# Patient Record
Sex: Male | Born: 1995 | Hispanic: Yes | Marital: Single | Smoking: Current some day smoker
Health system: Southern US, Community
[De-identification: ages and names within clinical notes are randomized; demographics above are authoritative.]

---

## 2020-12-14 ENCOUNTER — Emergency Department (HOSPITAL_COMMUNITY)
Admission: EM | Admit: 2020-12-14 | Discharge: 2020-12-14 | Disposition: A | Discharge status: Home / Self Care | Payer: Self-pay | Attending: Emergency Medicine | Admitting: Emergency Medicine

## 2020-12-14 ENCOUNTER — Emergency Department (HOSPITAL_COMMUNITY): Payer: Self-pay

## 2020-12-14 ENCOUNTER — Encounter (HOSPITAL_COMMUNITY): Payer: Self-pay

## 2020-12-14 ENCOUNTER — Other Ambulatory Visit: Payer: Self-pay

## 2020-12-14 DIAGNOSIS — R202 Paresthesia of skin: Secondary | ICD-10-CM | POA: Insufficient documentation

## 2020-12-14 DIAGNOSIS — F17200 Nicotine dependence, unspecified, uncomplicated: Secondary | ICD-10-CM | POA: Insufficient documentation

## 2020-12-14 DIAGNOSIS — R200 Anesthesia of skin: Secondary | ICD-10-CM

## 2020-12-14 DIAGNOSIS — R519 Headache, unspecified: Secondary | ICD-10-CM

## 2020-12-14 DIAGNOSIS — W11XXXA Fall on and from ladder, initial encounter: Secondary | ICD-10-CM | POA: Insufficient documentation

## 2020-12-14 DIAGNOSIS — M542 Cervicalgia: Secondary | ICD-10-CM | POA: Insufficient documentation

## 2020-12-14 DIAGNOSIS — S060X9A Concussion with loss of consciousness of unspecified duration, initial encounter: Secondary | ICD-10-CM | POA: Insufficient documentation

## 2020-12-14 DIAGNOSIS — M25512 Pain in left shoulder: Secondary | ICD-10-CM | POA: Insufficient documentation

## 2020-12-14 MED ORDER — PROCHLORPERAZINE EDISYLATE 10 MG/2ML IJ SOLN
10.0000 mg | Freq: Once | INTRAMUSCULAR | Status: AC
  Administered 2020-12-14: 21:00:00 10 mg via INTRAVENOUS
  Filled 2020-12-14: qty 2

## 2020-12-14 MED ORDER — PROCHLORPERAZINE MALEATE 10 MG PO TABS: 10.0000 mg | ORAL_TABLET | Freq: Three times a day (TID) | ORAL | 0 refills | Status: AC | PRN

## 2020-12-14 MED ORDER — DIPHENHYDRAMINE HCL 50 MG/ML IJ SOLN
25.0000 mg | Freq: Once | INTRAMUSCULAR | Status: AC
  Administered 2020-12-14: 21:00:00 25 mg via INTRAVENOUS
  Filled 2020-12-14: qty 1

## 2020-12-14 MED ORDER — SODIUM CHLORIDE 0.9 % IV BOLUS
1000.0000 mL | Freq: Once | INTRAVENOUS | Status: AC
  Administered 2020-12-14: 21:00:00 1000 mL via INTRAVENOUS

## 2020-12-14 NOTE — Discharge Instructions (Signed)
Your work-up today did not show evidence of acute traumatic injuries, fractures, or bleeding.  The MRI did not show evidence of cord injury.  I suspect you have a concussion causing your symptoms.  Please use the bed sent help with your headache and nausea.  Please maintain hydration.  Please rest.  Please follow-up in the concussion clinic with Dr. Katrinka Blazing.  If any symptoms change or worsen, please return to the nearest emergency department.

## 2020-12-14 NOTE — ED Triage Notes (Signed)
Pt presents after a head injury that occurred on Friday. Pt reports he fell approx 9 feet off of the roof, hitting his head. Pt reports that since then he has been vomiting.

## 2020-12-14 NOTE — ED Provider Notes (Signed)
[facility]-EMERGENCY DEPT Provider Note   CSN: [id number] Arrival date & time: 12/14/20  1753     History Chief Complaint  Patient presents with  . Emesis  . Head Injury    Allen Holland is a 24 y.o. male.  The history is provided by the patient and medical records. No language interpreter was used.  Fall This is a new problem. The current episode started more than 2 days ago. The problem occurs constantly. The problem has not changed since onset.Associated symptoms include headaches. Pertinent negatives include no chest pain, no abdominal pain and no shortness of breath. Nothing aggravates the symptoms. Nothing relieves the symptoms. He has tried nothing for the symptoms. The treatment provided no relief.       History reviewed. No pertinent past medical history.  There are no problems to display for this patient.   History reviewed. No pertinent surgical history.     History reviewed. No pertinent family history.  Social History   Tobacco Use  . Smoking status: Current Some Day Smoker  . Smokeless tobacco: Never Used  Substance Use Topics  . Alcohol use: Never  . Drug use: Never    Home Medications Prior to Admission medications   Not on File    Allergies    Patient has no known allergies.  Review of Systems   Review of Systems  Constitutional: Negative for chills, diaphoresis, fatigue and fever.  HENT: Negative for congestion.   Eyes: Positive for photophobia. Negative for visual disturbance.  Respiratory: Negative for cough, chest tightness, shortness of breath and wheezing.   Cardiovascular: Negative for chest pain.  Gastrointestinal: Positive for nausea and vomiting. Negative for abdominal pain, constipation and diarrhea.  Genitourinary: Negative for dysuria and flank pain.  Musculoskeletal: Negative for back pain, neck pain and neck stiffness.  Skin: Negative for rash and wound.  Neurological: Positive for  numbness and headaches. Negative for dizziness, facial asymmetry, weakness and light-headedness.  Psychiatric/Behavioral: Negative for agitation and confusion.  All other systems reviewed and are negative.   Physical Exam Updated Vital Signs BP (!) 144/65   Pulse (!) 54   Temp 98.6 F (37 C) (Oral)   Resp 18   SpO2 100%   Physical Exam Vitals and nursing note reviewed.  Constitutional:      General: He is not in acute distress.    Appearance: He is well-developed and well-nourished. He is not ill-appearing, toxic-appearing or diaphoretic.  HENT:     Head: Normocephalic and atraumatic.     Nose: No congestion or rhinorrhea.     Mouth/Throat:     Mouth: Mucous membranes are moist.     Pharynx: No oropharyngeal exudate or posterior oropharyngeal erythema.  Eyes:     Extraocular Movements: Extraocular movements intact.     Conjunctiva/sclera: Conjunctivae normal.     Pupils: Pupils are equal, round, and reactive to light.  Neck:   Cardiovascular:     Rate and Rhythm: Normal rate and regular rhythm.     Pulses: Normal pulses.     Heart sounds: No murmur heard.   Pulmonary:     Effort: Pulmonary effort is normal. No respiratory distress.     Breath sounds: Normal breath sounds. No wheezing, rhonchi or rales.  Chest:     Chest wall: No tenderness.  Abdominal:     General: Abdomen is flat. There is no distension.     Palpations: Abdomen is soft.     Tenderness: There is  no abdominal tenderness. There is no right CVA tenderness, left CVA tenderness, guarding or rebound.  Musculoskeletal:        General: Tenderness present. No edema.       Arms:     Cervical back: Full passive range of motion without pain and neck supple. Tenderness present. No erythema, rigidity or crepitus. Muscular tenderness present. No pain with movement or spinous process tenderness.     Right lower leg: No edema.     Left lower leg: No edema.  Skin:    General: Skin is warm and dry.     Capillary  Refill: Capillary refill takes less than 2 seconds.     Findings: No erythema.  Neurological:     General: No focal deficit present.     Mental Status: He is alert and oriented to person, place, and time.     Cranial Nerves: No cranial nerve deficit, dysarthria or facial asymmetry.     Sensory: Sensory deficit present.     Motor: No weakness, tremor or abnormal muscle tone.     Coordination: Coordination normal.     Comments: Mild numbness in both palms.  Otherwise normal grip strength, sensation and strength in extremities.  Normal finger-nose-finger testing bilaterally.  Neurology exam otherwise unremarkable.  Psychiatric:        Mood and Affect: Mood and affect and mood normal.     ED Results / Procedures / Treatments   Labs (all labs ordered are listed, but only abnormal results are displayed) Labs Reviewed - No data to display  EKG None  Radiology CT Head Wo Contrast  Result Date: 12/14/2020 CLINICAL DATA:  Head trauma fell 9 feet from the roof hit head with vomiting EXAM: CT HEAD WITHOUT CONTRAST TECHNIQUE: Contiguous axial images were obtained from the base of the skull through the vertex without intravenous contrast. COMPARISON:  None. FINDINGS: Brain: No evidence of acute infarction, hemorrhage, hydrocephalus, extra-axial collection or mass lesion/mass effect. Vascular: No hyperdense vessel or unexpected calcification. Skull: Normal. Negative for fracture or focal lesion. Sinuses/Orbits: No acute finding. Other: None. IMPRESSION: Negative non contrasted CT appearance of the brain. Electronically Signed   By: Jasmine Pang M.D.   On: 12/14/2020 18:53   CT CERVICAL SPINE WO CONTRAST  Result Date: 12/14/2020 CLINICAL DATA:  Larey Seat off growth EXAM: CT CERVICAL SPINE WITHOUT CONTRAST TECHNIQUE: Multidetector CT imaging of the cervical spine was performed without intravenous contrast. Multiplanar CT image reconstructions were also generated. COMPARISON:  None. FINDINGS: Alignment:  Normal. Skull base and vertebrae: No acute fracture. No primary bone lesion or focal pathologic process. Soft tissues and spinal canal: No prevertebral fluid or swelling. No visible canal hematoma. Disc levels:  Within normal limits Upper chest: Negative. Other: None. IMPRESSION: Negative non contrasted CT appearance of the cervical spine. Electronically Signed   By: Jasmine Pang M.D.   On: 12/14/2020 18:55   MR Cervical Spine Wo Contrast  Result Date: 12/14/2020 CLINICAL DATA:  Fall from ladder.  Bilateral hand numbness. EXAM: MRI CERVICAL SPINE WITHOUT CONTRAST TECHNIQUE: Multiplanar, multisequence MR imaging of the cervical spine was performed. No intravenous contrast was administered. COMPARISON:  None. FINDINGS: Alignment: Physiologic. Vertebrae: No fracture, evidence of discitis, or bone lesion. Cord: Normal signal and morphology. Posterior Fossa, vertebral arteries, paraspinal tissues: Negative. Disc levels: No disc herniation or stenosis. IMPRESSION: Normal cervical spine MRI. Electronically Signed   By: Deatra Robinson M.D.   On: 12/14/2020 21:29   DG Shoulder Left  Result Date: 12/14/2020 CLINICAL  DATA:  Fall from ladder, shoulder pain EXAM: LEFT SHOULDER - 2+ VIEW COMPARISON:  None. FINDINGS: There is no evidence of fracture or dislocation. There is no evidence of arthropathy or other focal bone abnormality. Soft tissues are unremarkable. IMPRESSION: Negative. Electronically Signed   By: Guadlupe SpanishPraneil  Patel M.D.   On: 12/14/2020 20:27    Procedures Procedures (including critical care time)  Medications Ordered in ED Medications  prochlorperazine (COMPAZINE) injection 10 mg (10 mg Intravenous Given 12/14/20 2032)  diphenhydrAMINE (BENADRYL) injection 25 mg (25 mg Intravenous Given 12/14/20 2033)  sodium chloride 0.9 % bolus 1,000 mL (1,000 mLs Intravenous New Bag/Given 12/14/20 2031)    ED Course  I have reviewed the triage vital signs and the nursing notes.  Pertinent labs & imaging  results that were available during my care of the patient were reviewed by me and considered in my medical decision making (see chart for details).    MDM Rules/Calculators/A&P                          Crissie FiguresJuan Daniel Fernandez Holland is a 24 y.o. male with no significant past medical history who presents with headache, neck pain, left shoulder pain, and bilateral hand numbness after a fall.  He reports that he was working on a ladder 4 days ago when he fell off at approximate 9 feet landing on his head and neck.  He reports he had onset of pain and had continued pain for the last 4 days.  He reports nausea and vomiting and photosensitivity.  He reports no history of significant injuries recently but did [place]  He reports he is right-handed but has some pain in his left shoulder.  He reports bilateral hand tingling and numbness that has been waxing and waning ever since the fall.  He reports no chest pain, shortness of breath, or difficulty with ambulation.  No leg symptoms.  No loss of bowel or bladder control.  He denies any syncopal episodes or passing out.  Denies any speech difficulties or vision changes.  Denies other complaints.  He reports his headache is moderate to severe.  On exam, lungs are clear and chest is nontender.  Abdomen is nontender.  He does have some tenderness in his left paraspinal neck.  No carotid bruit appreciated.  Normal sensation and strength in legs.  Normal strength and finger-nose-finger testing of both upper extremities.  He does have some sort of tingling on his palms bilaterally with no focal numbness otherwise.  Pupils are symmetric and reactive with normal extraocular movements.  Symmetric smile.  Exam otherwise unremarkable.  No laceration seen.  He did not have any exacerbation of symptoms with neck movement and did not have any neck stiffness.  Patient had CT head and neck in triage for the traumatic injuries with persistent headache  with nausea and vomiting.  CT imaging was reassuring of the head and neck.  Patient was given some nausea medicine initially and then had a headache cocktail which completely resolved his symptoms.  Patient was having numbness in both hands so a an MRI of his C-spine was ordered to look for an occult central cord syndrome.  MRI did not show cord abnormality.  Patient reports his symptoms continue to improve and he is feeling better after some fluids from the dehydration and vomiting.  Patient given prescription for Compazine and will be discharged to follow-up with concussion team and PCP.  I suspect the symptoms are primarily to a concussion.  Patient agrees with plan of care and will be discharged for outpatient follow-up.     Final Clinical Impression(s) / ED Diagnoses Final diagnoses:  Fall from ladder, initial encounter  Acute nonintractable headache, unspecified headache type  Bilateral hand numbness  Neck pain  Acute pain of left shoulder  Concussion with loss of consciousness, initial encounter    Rx / DC Orders ED Discharge Orders         Ordered    prochlorperazine (COMPAZINE) 10 MG tablet  Every 8 hours PRN        12/14/20 2204         Clinical Impression: 1. Fall from ladder, initial encounter   2. Acute nonintractable headache, unspecified headache type   3. Bilateral hand numbness   4. Neck pain   5. Acute pain of left shoulder   6. Concussion with loss of consciousness, initial encounter     Disposition: Discharge  Condition: Good  I have discussed the results, Dx and Tx plan with the pt(& family if present). He/she/they expressed understanding and agree(s) with the plan. Discharge instructions discussed at great length. Strict return precautions discussed and pt &/or family have verbalized understanding of the instructions. No further questions at time of discharge.    New Prescriptions   PROCHLORPERAZINE (COMPAZINE) 10 MG TABLET    Take 1 tablet (10 mg  total) by mouth every 8 (eight) hours as needed for nausea or vomiting.    Follow Up: Judi Saa, DO [address] [place] [address] [phone]   his concussion clinic     Jatoya Armbrister, Canary Brim, MD 12/14/20 2211

## 2021-06-01 IMAGING — MR MR CERVICAL SPINE W/O CM
6 series · 36 of 48 positions shown · non-contrast
Comparison: None.

CLINICAL DATA: Fall from ladder.  Bilateral hand numbness.

EXAM:
MRI CERVICAL SPINE WITHOUT CONTRAST
TECHNIQUE: Multiplanar, multisequence MR imaging of the cervical spine was
performed. No intravenous contrast was administered.

[Series 5: T1 · sagittal · 3.0mm · 0.66mm/px · 4 of 18 slices shown (1 of 2)]
[im 1/18]
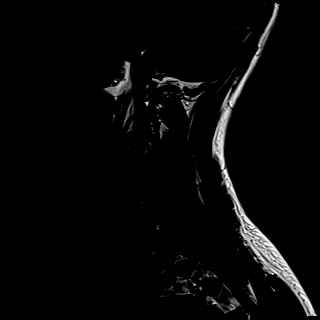
[im 6/18]
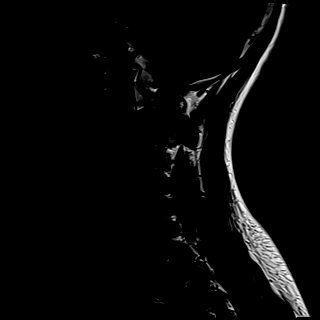
[im 12/18]
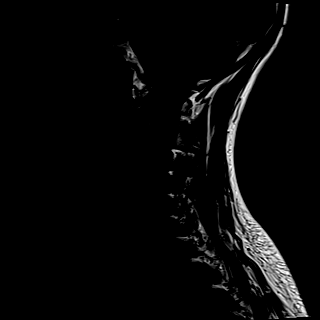
[im 18/18]
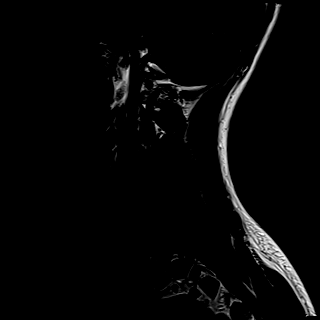

[Series 6: T2 · sagittal · 3.0mm · 0.66mm/px · 5 of 18 slices shown (1 of 2)]
[im 1/18]
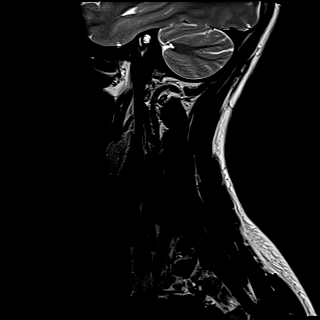
[im 5/18]
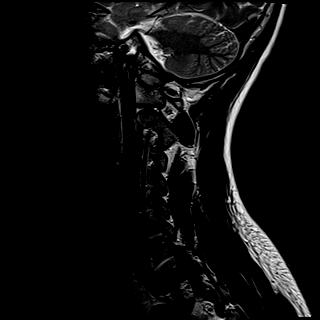
[im 9/18]
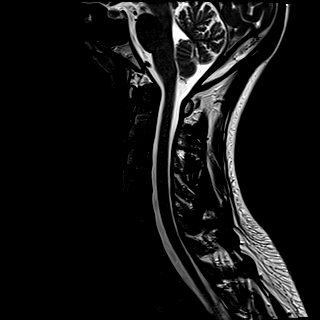
[im 13/18]
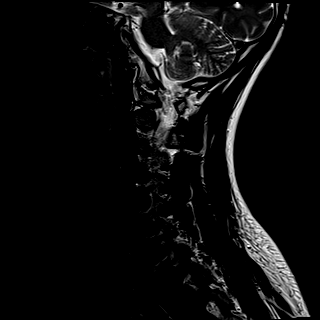
[im 18/18]
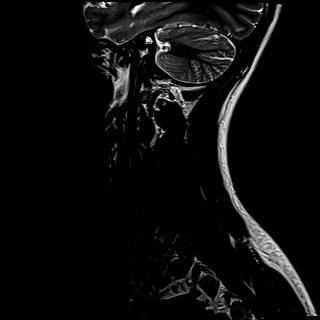

[Series 7: STIR · sagittal · 3.0mm · 0.82mm/px · 5 of 18 slices shown]
[im 1/18]
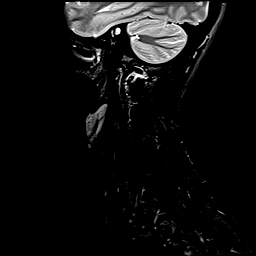
[im 5/18]
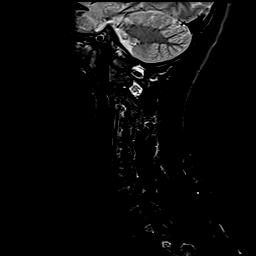
[im 9/18]
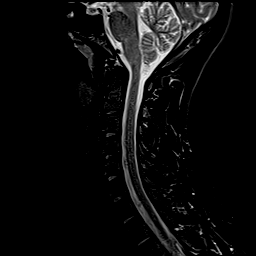
[im 13/18]
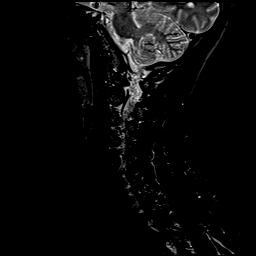
[im 18/18]
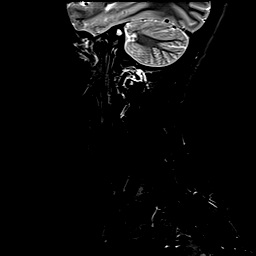

[Series 8: GRE · axial · 3.0mm · 0.35mm/px · z∈[-102,-78]mm · 2 of 39 slices shown]
[im 1/39]
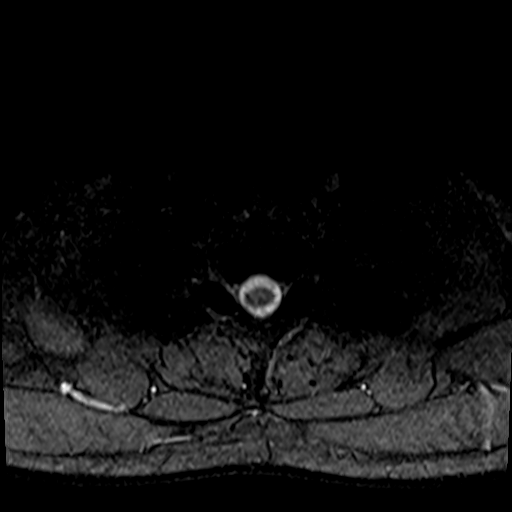
[im 8/39]
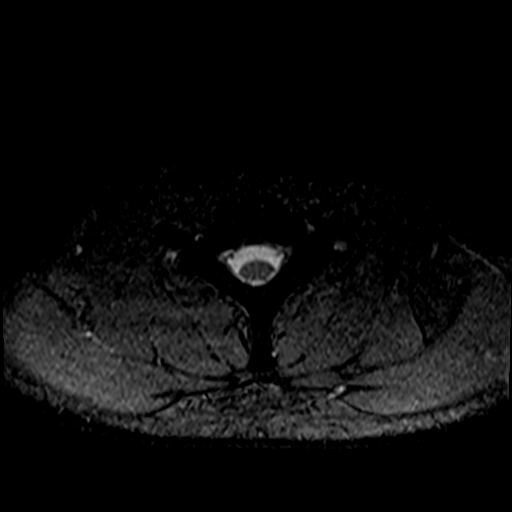

[Series 9: T1 · axial · 3.0mm · 0.35mm/px · z∈[-108,+35]mm · 9 of 42 slices shown (2 of 2)]
[im 1/42]
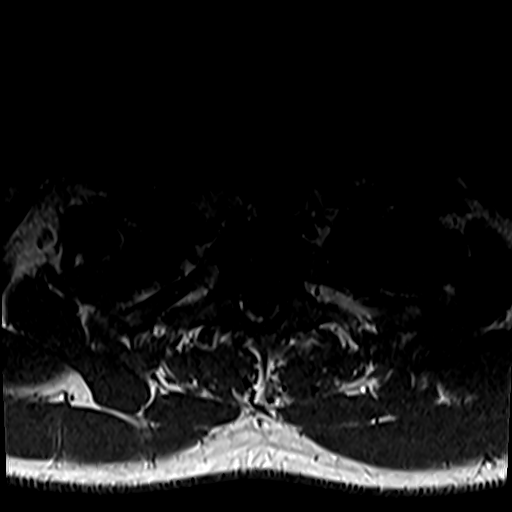
[im 8/42]
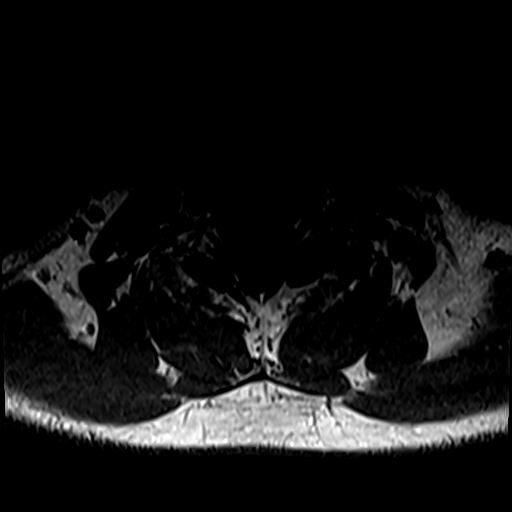
[im 12/42]
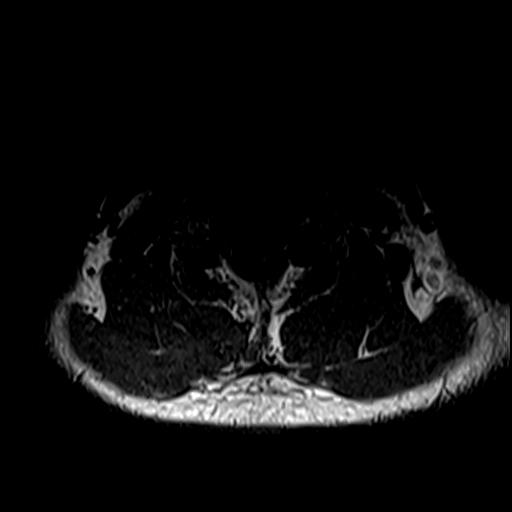
[im 19/42]
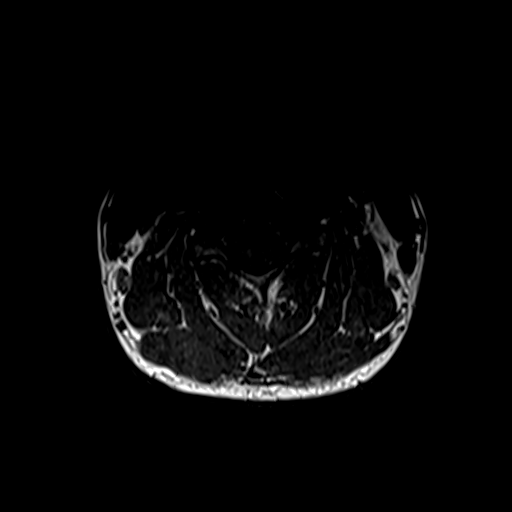
[im 23/42]
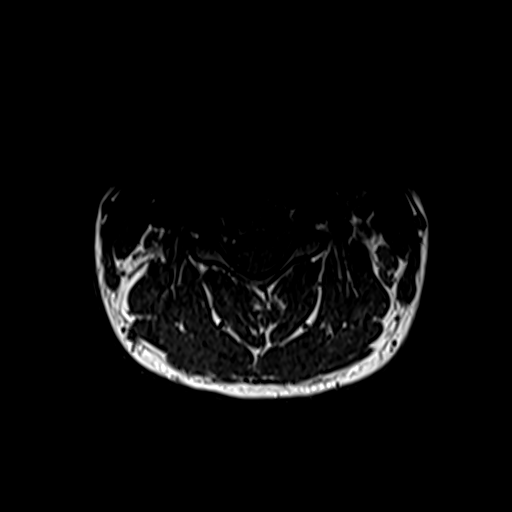
[im 30/42]
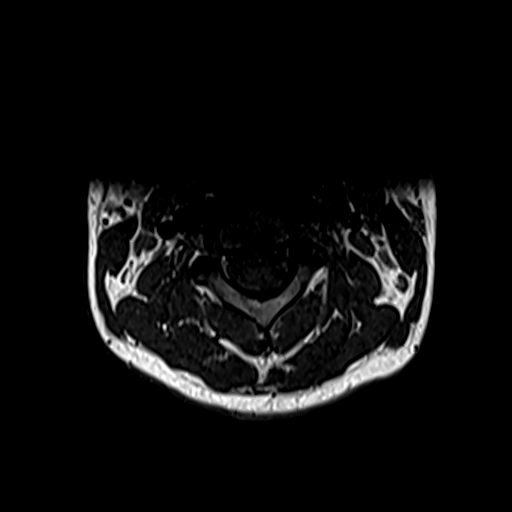
[im 34/42]
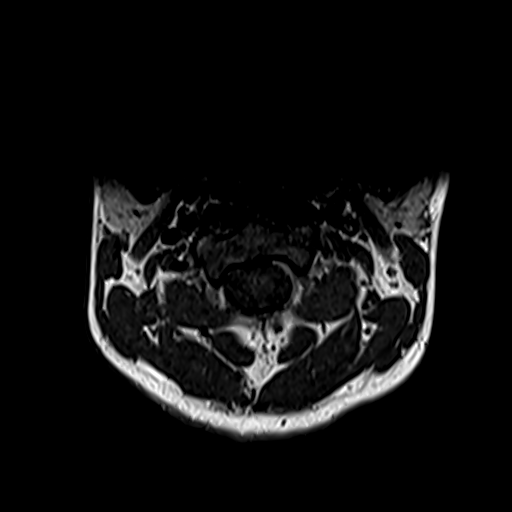
[im 38/42]
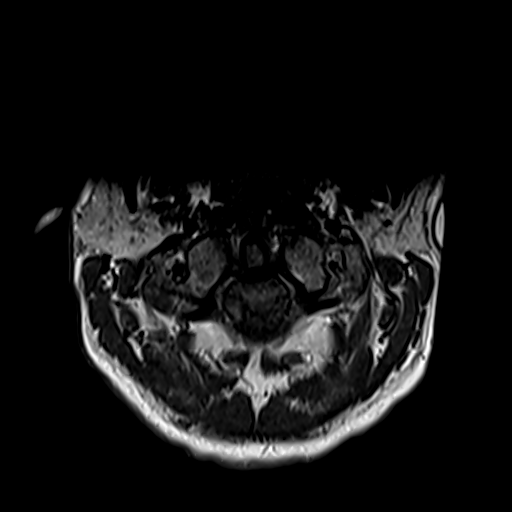
[im 42/42]
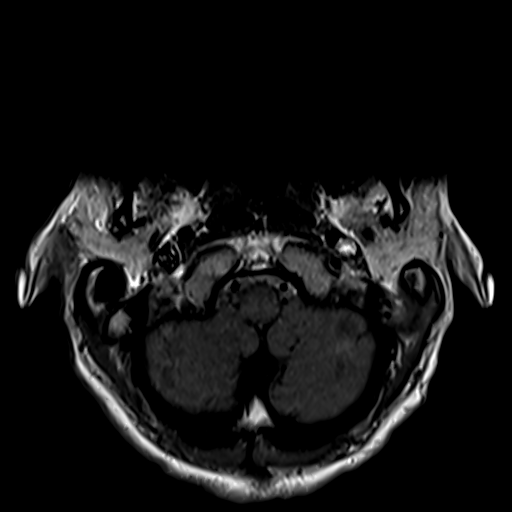

[Series 10: T2 · axial · 3.0mm · 0.70mm/px · z∈[-104,+32]mm · 11 of 40 slices shown (2 of 2)]
[im 1/40]
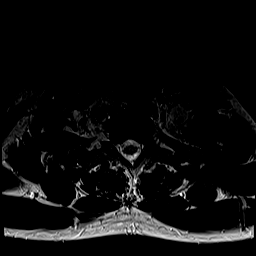
[im 4/40]
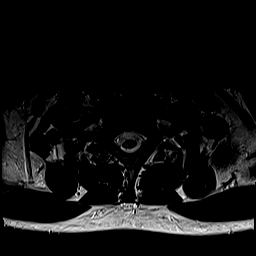
[im 8/40]
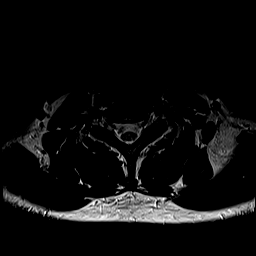
[im 12/40]
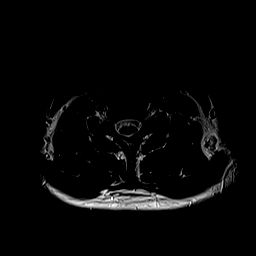
[im 16/40]
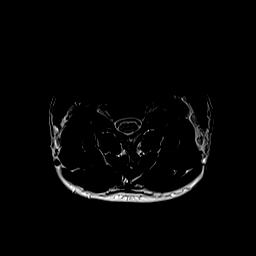
[im 20/40]
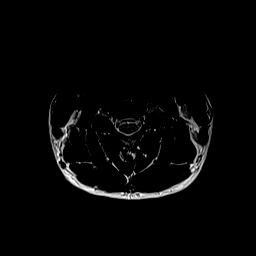
[im 24/40]
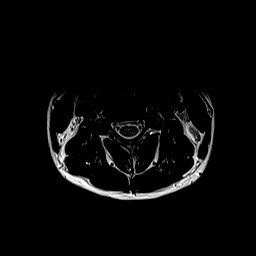
[im 28/40]
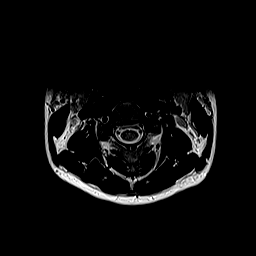
[im 32/40]
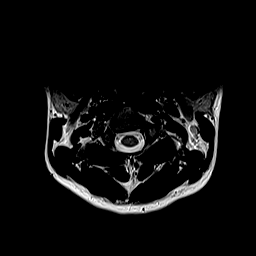
[im 36/40]
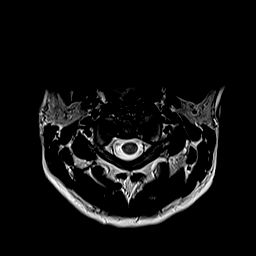
[im 40/40]
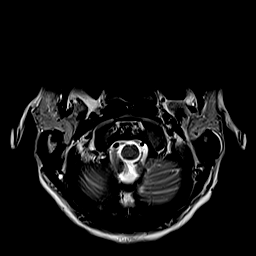

[36 of 48 positions shown; findings below may reference images not displayed]

FINDINGS: Alignment: Physiologic.

Vertebrae: No fracture, evidence of discitis, or bone lesion.

Cord: Normal signal and morphology.

Posterior Fossa, vertebral arteries, paraspinal tissues: Negative.

Disc levels:

No disc herniation or stenosis.
IMPRESSION: Normal cervical spine MRI.
